# Patient Record
Sex: Female | Born: 1975 | Race: White | Hispanic: Yes | Marital: Married | Smoking: Never smoker
Health system: Southern US, Community
[De-identification: ages and names within clinical notes are randomized; demographics above are authoritative.]

---

## 2019-07-31 ENCOUNTER — Encounter (HOSPITAL_COMMUNITY): Payer: Self-pay | Admitting: Emergency Medicine

## 2019-07-31 ENCOUNTER — Other Ambulatory Visit: Payer: Self-pay

## 2019-07-31 ENCOUNTER — Emergency Department (HOSPITAL_COMMUNITY)
Admission: EM | Admit: 2019-07-31 | Discharge: 2019-08-01 | Disposition: A | Discharge status: Home / Self Care | Payer: Self-pay | Attending: Emergency Medicine | Admitting: Emergency Medicine

## 2019-07-31 DIAGNOSIS — H9203 Otalgia, bilateral: Secondary | ICD-10-CM | POA: Insufficient documentation

## 2019-07-31 DIAGNOSIS — Z5321 Procedure and treatment not carried out due to patient leaving prior to being seen by health care provider: Secondary | ICD-10-CM | POA: Insufficient documentation

## 2019-07-31 DIAGNOSIS — R519 Headache, unspecified: Secondary | ICD-10-CM | POA: Insufficient documentation

## 2019-07-31 NOTE — ED Triage Notes (Signed)
Patient reports bilateral ear pain with drainage and headache onset last week , denies head injury / no hearing loss , denies fever or chills.

## 2019-08-01 NOTE — ED Notes (Signed)
Called several times for vitals and no response 

## 2021-05-03 ENCOUNTER — Other Ambulatory Visit: Payer: Self-pay

## 2021-05-03 ENCOUNTER — Emergency Department (HOSPITAL_COMMUNITY): Payer: No Typology Code available for payment source

## 2021-05-03 ENCOUNTER — Emergency Department (HOSPITAL_COMMUNITY)
Admission: EM | Admit: 2021-05-03 | Discharge: 2021-05-03 | Disposition: A | Discharge status: Home / Self Care | Payer: No Typology Code available for payment source | Attending: Emergency Medicine | Admitting: Emergency Medicine

## 2021-05-03 DIAGNOSIS — S8002XA Contusion of left knee, initial encounter: Secondary | ICD-10-CM | POA: Diagnosis not present

## 2021-05-03 DIAGNOSIS — S299XXA Unspecified injury of thorax, initial encounter: Secondary | ICD-10-CM | POA: Insufficient documentation

## 2021-05-03 DIAGNOSIS — R52 Pain, unspecified: Secondary | ICD-10-CM

## 2021-05-03 DIAGNOSIS — N9489 Other specified conditions associated with female genital organs and menstrual cycle: Secondary | ICD-10-CM | POA: Diagnosis not present

## 2021-05-03 DIAGNOSIS — R519 Headache, unspecified: Secondary | ICD-10-CM | POA: Diagnosis not present

## 2021-05-03 DIAGNOSIS — R0789 Other chest pain: Secondary | ICD-10-CM

## 2021-05-03 DIAGNOSIS — Y92410 Unspecified street and highway as the place of occurrence of the external cause: Secondary | ICD-10-CM | POA: Insufficient documentation

## 2021-05-03 DIAGNOSIS — S8992XA Unspecified injury of left lower leg, initial encounter: Secondary | ICD-10-CM | POA: Diagnosis present

## 2021-05-03 DIAGNOSIS — M25562 Pain in left knee: Secondary | ICD-10-CM

## 2021-05-03 LAB — BASIC METABOLIC PANEL
Anion gap: 8 (ref 5–15)
BUN: 15 mg/dL (ref 6–20)
CO2: 23 mmol/L (ref 22–32)
Calcium: 9 mg/dL (ref 8.9–10.3)
Chloride: 108 mmol/L (ref 98–111)
Creatinine, Ser: 0.63 mg/dL (ref 0.44–1.00)
GFR, Estimated: >60 mL/min (ref >60)
Glucose, Bld: 107 mg/dL — ABNORMAL HIGH (ref 70–99)
Potassium: 4.1 mmol/L (ref 3.5–5.1)
Sodium: 139 mmol/L (ref 135–145)

## 2021-05-03 LAB — CBC
HCT: 40.7 % (ref 36.0–46.0)
Hemoglobin: 13.7 g/dL (ref 12.0–15.0)
MCH: 28.3 pg (ref 26.0–34.0)
MCHC: 33.7 g/dL (ref 30.0–36.0)
MCV: 84.1 fL (ref 80.0–100.0)
Platelets: 333 10*3/uL (ref 150–400)
RBC: 4.84 MIL/uL (ref 3.87–5.11)
RDW: 13.9 % (ref 11.5–15.5)
WBC: 11.7 10*3/uL — ABNORMAL HIGH (ref 4.0–10.5)
nRBC: 0 % (ref 0.0–0.2)

## 2021-05-03 LAB — I-STAT BETA HCG BLOOD, ED (MC, WL, AP ONLY): I-stat hCG, quantitative: <5 m[IU]/mL (ref <5)

## 2021-05-03 LAB — TROPONIN I (HIGH SENSITIVITY): Troponin I (High Sensitivity): 5 ng/L (ref <18)

## 2021-05-03 MED ORDER — ACETAMINOPHEN 325 MG PO TABS: 650.0000 mg | ORAL_TABLET | Freq: Four times a day (QID) | ORAL | 0 refills | Status: AC | PRN

## 2021-05-03 MED ORDER — FENTANYL CITRATE PF 50 MCG/ML IJ SOSY
50.0000 ug | PREFILLED_SYRINGE | Freq: Once | INTRAMUSCULAR | Status: AC
  Administered 2021-05-03: 15:00:00 50 ug via INTRAVENOUS
  Filled 2021-05-03: qty 1

## 2021-05-03 MED ORDER — ONDANSETRON HCL 4 MG/2ML IJ SOLN
4.0000 mg | Freq: Once | INTRAMUSCULAR | Status: AC
  Administered 2021-05-03: 14:00:00 4 mg via INTRAVENOUS
  Filled 2021-05-03: qty 2

## 2021-05-03 MED ORDER — KETOROLAC TROMETHAMINE 30 MG/ML IJ SOLN
30.0000 mg | Freq: Once | INTRAMUSCULAR | Status: AC
  Administered 2021-05-03: 15:00:00 30 mg via INTRAVENOUS
  Filled 2021-05-03: qty 1

## 2021-05-03 MED ORDER — MORPHINE SULFATE (PF) 4 MG/ML IV SOLN
4.0000 mg | Freq: Once | INTRAVENOUS | Status: AC
  Administered 2021-05-03: 12:00:00 4 mg via INTRAVENOUS
  Filled 2021-05-03: qty 1

## 2021-05-03 MED ORDER — IOHEXOL 300 MG/ML  SOLN
100.0000 mL | Freq: Once | INTRAMUSCULAR | Status: AC | PRN
  Administered 2021-05-03: 14:00:00 100 mL via INTRAVENOUS

## 2021-05-03 MED ORDER — IBUPROFEN 600 MG PO TABS: 600.0000 mg | ORAL_TABLET | Freq: Four times a day (QID) | ORAL | 0 refills | Status: AC | PRN

## 2021-05-03 MED ORDER — OXYCODONE HCL 5 MG PO TABS: 5.0000 mg | ORAL_TABLET | Freq: Four times a day (QID) | ORAL | 0 refills | Status: AC | PRN
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
